# Patient Record
Sex: Male | Born: 1965 | Hispanic: No | Marital: Single | Smoking: Never smoker
Health system: Southern US, Community
[De-identification: ages and names within clinical notes are randomized; demographics above are authoritative.]

## PROBLEM LIST (undated history)

## (undated) DIAGNOSIS — E785 Hyperlipidemia, unspecified: Secondary | ICD-10-CM

## (undated) DIAGNOSIS — I10 Essential (primary) hypertension: Secondary | ICD-10-CM

## (undated) DIAGNOSIS — E119 Type 2 diabetes mellitus without complications: Secondary | ICD-10-CM

## (undated) HISTORY — DX: Hyperlipidemia, unspecified: E78.5

## (undated) HISTORY — DX: Essential (primary) hypertension: I10

## (undated) HISTORY — DX: Type 2 diabetes mellitus without complications: E11.9

---

## 2010-08-02 ENCOUNTER — Encounter: Admission: RE | Admit: 2010-08-02 | Discharge: 2010-08-02 | Payer: Self-pay | Admitting: Geriatric Medicine

## 2011-09-06 IMAGING — US US ART/VEN ABD/PELV/SCROTUM DOPPLER COMPLETE
1 series · 14 of 18 positions shown · non-contrast
Comparison: None.

CLINICAL DATA: Left testicular pain and swelling

SCROTAL ULTRASOUND
DOPPLER ULTRASOUND OF THE TESTICLES
TECHNIQUE: Complete ultrasound examination of the testicles,
epididymis, and other scrotal structures was performed.  Color and
spectral Doppler ultrasound were also utilized to evaluate blood
flow to the testicles.

[Series 1: us art/ven abd/pelv/scrotum doppler complete · 0.07mm/px · 14 of 18 slices shown]
[im 1/18]
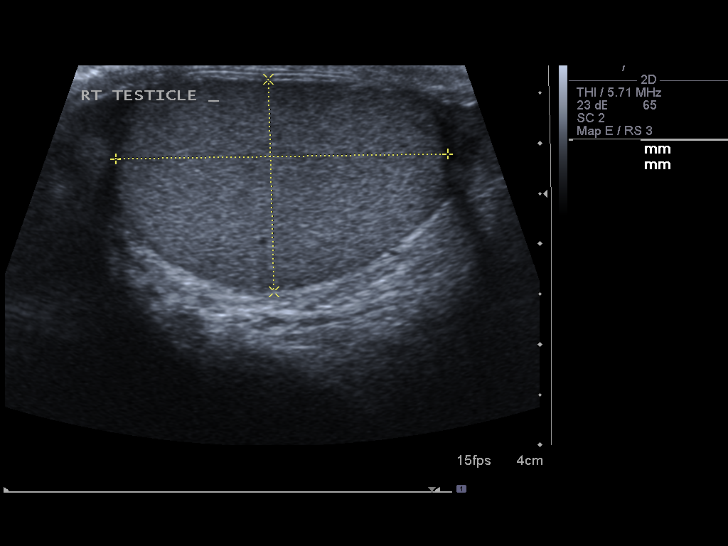
[im 2/18]
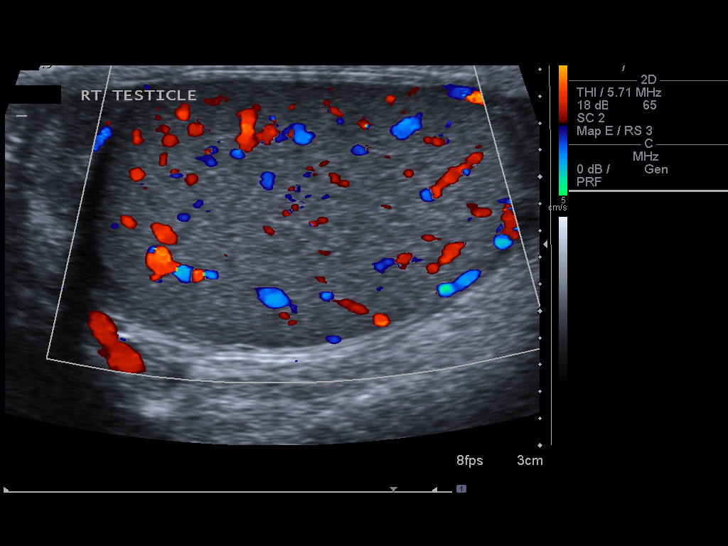
[im 4/18]
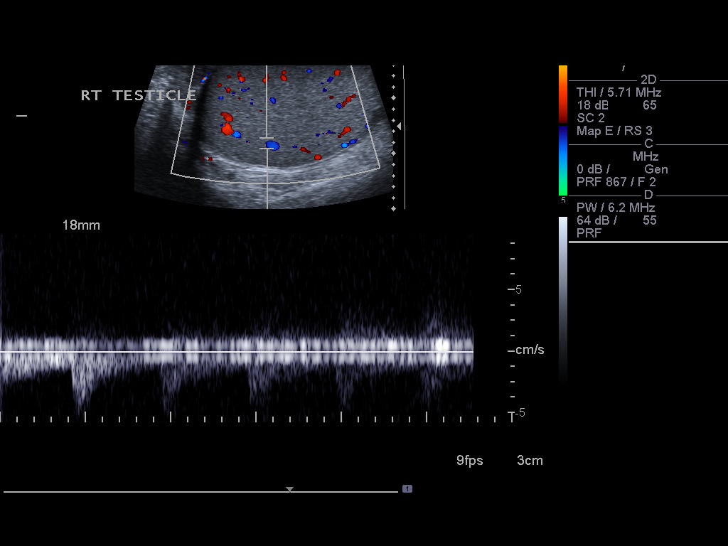
[im 5/18]
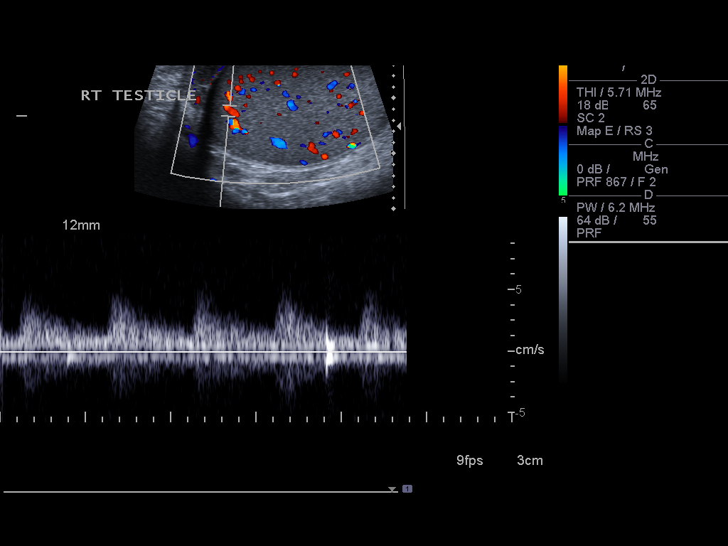
[im 6/18]
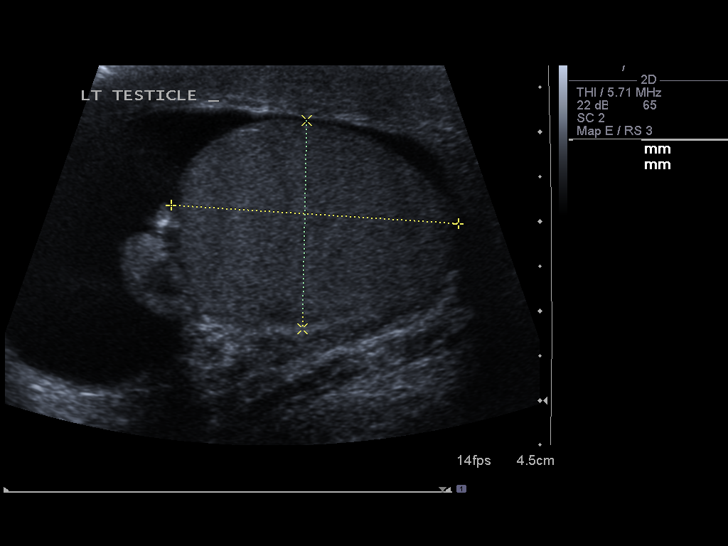
[im 8/18]
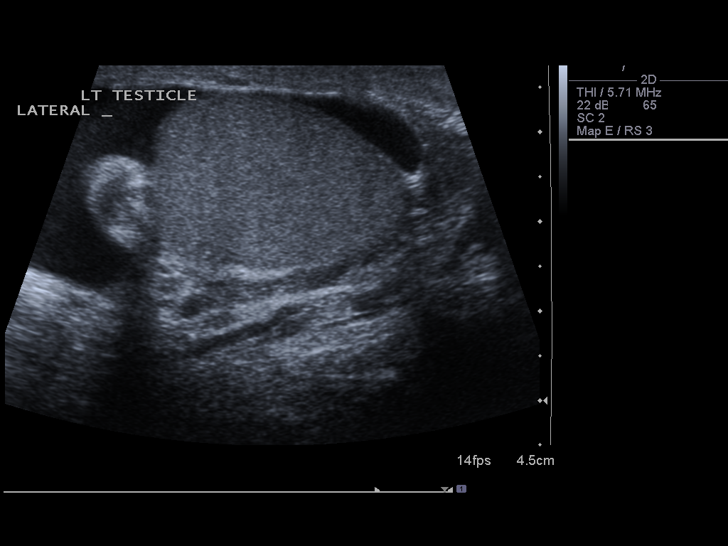
[im 9/18]
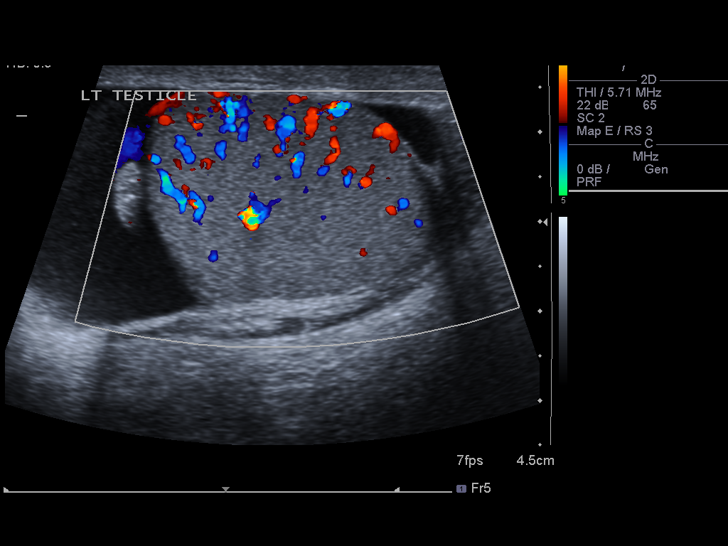
[im 10/18]
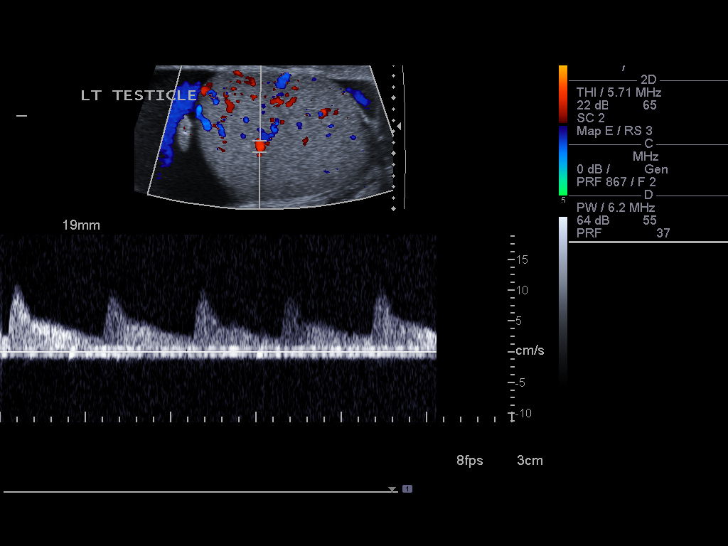
[im 11/18]
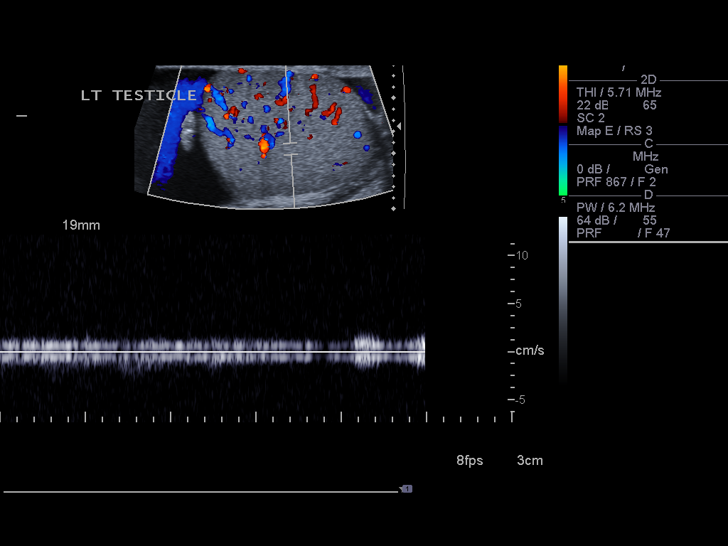
[im 13/18]
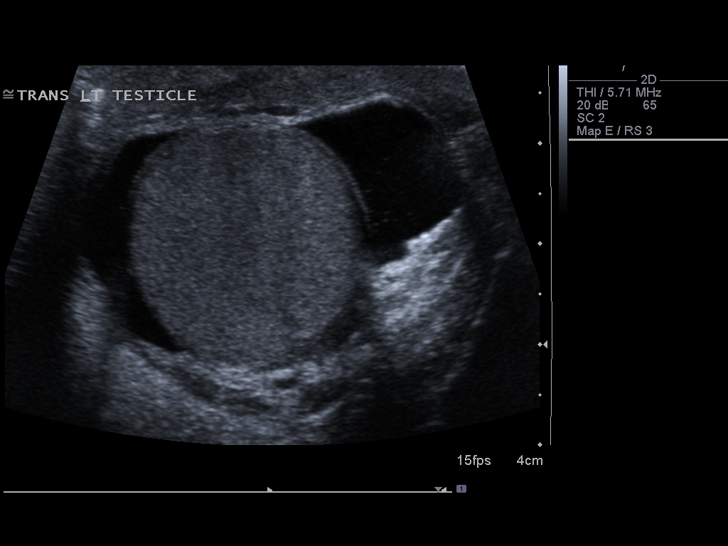
[im 14/18]
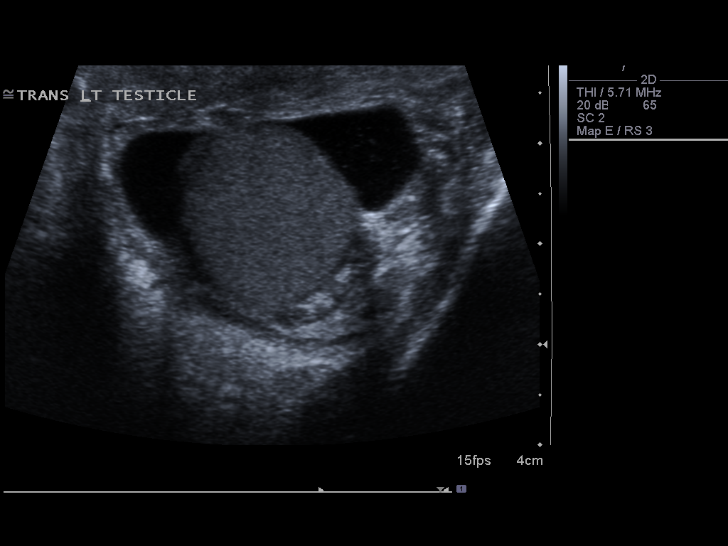
[im 15/18]
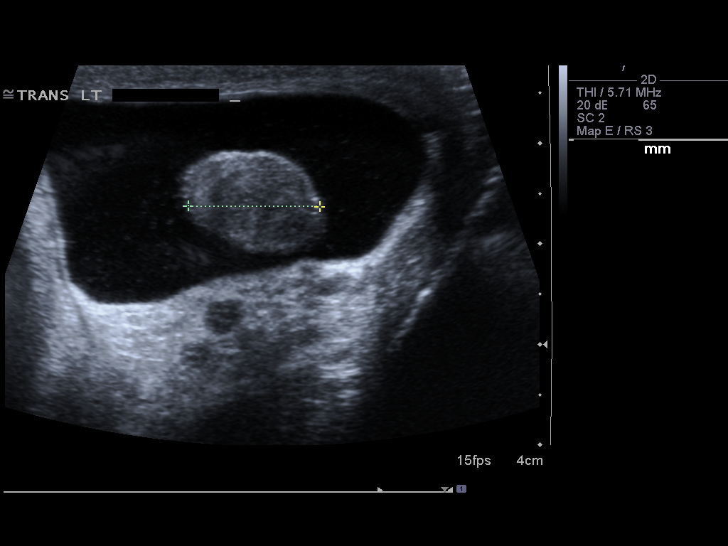
[im 17/18]
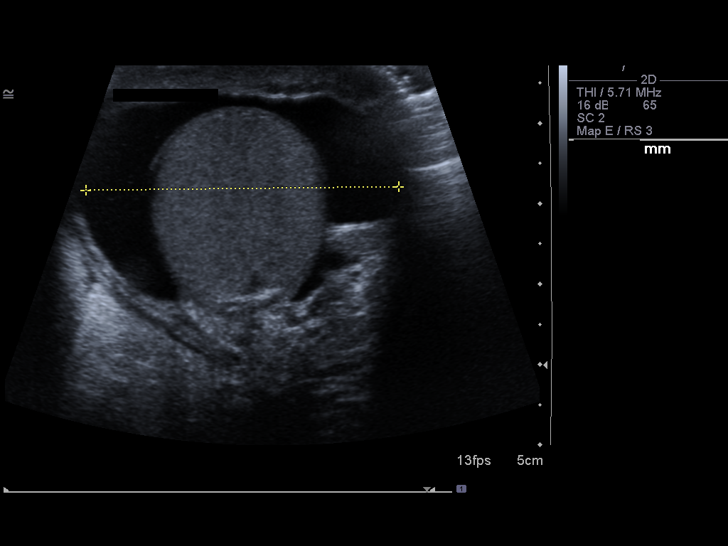
[im 18/18]
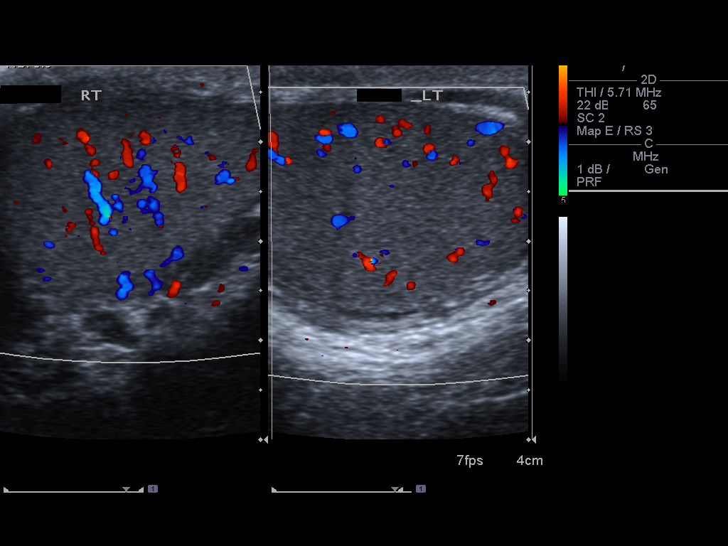

[14 of 18 positions shown; findings below may reference images not displayed]

FINDINGS: The testicles are normal in size and contour with no
focal lesions.  There is symmetrical color flow Doppler and normal
arterial and venous wave forms.

No lesions of either epididymis.  There is a moderate-sized left
hydrocele.  No varicocele.
IMPRESSION: Normal except for left hydrocele.

## 2014-05-27 ENCOUNTER — Ambulatory Visit: Payer: Self-pay

## 2014-06-05 ENCOUNTER — Ambulatory Visit: Payer: No Typology Code available for payment source | Admitting: Internal Medicine

## 2014-06-05 ENCOUNTER — Encounter: Payer: Self-pay | Admitting: Internal Medicine

## 2014-06-05 VITALS — BP 127/85 | HR 77 | Temp 98.1°F | Resp 20 | Ht 66.0 in | Wt 174.0 lb

## 2014-06-05 DIAGNOSIS — E1165 Type 2 diabetes mellitus with hyperglycemia: Principal | ICD-10-CM

## 2014-06-05 DIAGNOSIS — E785 Hyperlipidemia, unspecified: Secondary | ICD-10-CM

## 2014-06-05 DIAGNOSIS — M419 Scoliosis, unspecified: Secondary | ICD-10-CM

## 2014-06-05 DIAGNOSIS — IMO0001 Reserved for inherently not codable concepts without codable children: Secondary | ICD-10-CM | POA: Insufficient documentation

## 2014-06-05 LAB — HEMOGLOBIN A1C
Hgb A1c MFr Bld: 6.7 % — ABNORMAL HIGH (ref <5.7)
MEAN PLASMA GLUCOSE: 146 mg/dL — AB (ref <117)

## 2014-06-05 MED ORDER — METFORMIN HCL 500 MG PO TABS: 500.0000 mg | ORAL_TABLET | Freq: Every day | ORAL | Status: DC

## 2014-06-05 MED ORDER — LOVASTATIN 40 MG PO TABS: 40.0000 mg | ORAL_TABLET | Freq: Every day | ORAL | Status: DC

## 2014-06-05 NOTE — Progress Notes (Unsigned)
   Subjective:    Patient ID: Dalton Garza, male    DOB: 04-16-1966, 48 y.o.   MRN: [id number]  HPI: Pt here to establish care    Review of Systems     Objective:   Physical Exam        Assessment & Plan:  1. DM II: pt does not check blood sugars at home. He denies any symptoms of hyper or hypoglycemia. Will Check Hemoglobin A1C and renal function today. Also check U/U for proteinuria.  2. Hyperlipidemia: ASCVD 10 year risk 7..1% and lifetime calculated risk 50%. Recommendations are for moderate intensity statin. Will increase Louvastatin to 40 mg daily.

## 2014-06-06 LAB — URINALYSIS, COMPLETE
BACTERIA UA: NONE SEEN
Bilirubin Urine: NEGATIVE
Casts: NONE SEEN
Crystals: NONE SEEN
GLUCOSE, UA: NEGATIVE mg/dL
Hgb urine dipstick: NEGATIVE
KETONES UR: NEGATIVE mg/dL
LEUKOCYTES UA: NEGATIVE
Nitrite: NEGATIVE
Protein, ur: NEGATIVE mg/dL
SQUAMOUS EPITHELIAL / LPF: NONE SEEN
Specific Gravity, Urine: 1.02 (ref 1.005–1.030)
Urobilinogen, UA: 0.2 mg/dL (ref 0.0–1.0)
pH: 5.5 (ref 5.0–8.0)

## 2014-06-06 LAB — COMPREHENSIVE METABOLIC PANEL
ALK PHOS: 68 U/L (ref 39–117)
ALT: 59 U/L — AB (ref 0–53)
AST: 29 U/L (ref 0–37)
Albumin: 4.7 g/dL (ref 3.5–5.2)
BILIRUBIN TOTAL: 0.6 mg/dL (ref 0.2–1.2)
BUN: 9 mg/dL (ref 6–23)
CALCIUM: 9.6 mg/dL (ref 8.4–10.5)
CO2: 22 mEq/L (ref 19–32)
Chloride: 103 mEq/L (ref 96–112)
Creat: 0.76 mg/dL (ref 0.50–1.35)
Glucose, Bld: 98 mg/dL (ref 70–99)
POTASSIUM: 4 meq/L (ref 3.5–5.3)
SODIUM: 138 meq/L (ref 135–145)
Total Protein: 7.3 g/dL (ref 6.0–8.3)

## 2014-06-13 ENCOUNTER — Telehealth: Payer: Self-pay | Admitting: Internal Medicine

## 2014-06-13 NOTE — Telephone Encounter (Signed)
Patient would like RX for Lisinopril 5mg . He neglected to bring in bottle at initial visit and now is completely out.

## 2014-06-16 MED ORDER — LISINOPRIL 5 MG PO TABS: 5.0000 mg | ORAL_TABLET | Freq: Every day | ORAL | Status: DC

## 2014-06-16 NOTE — Telephone Encounter (Signed)
Pt called requesting Lisinopril 5 mg. He states that he forgot to disclose the use of this medication on his initial visit despite being queried about all medication use via an Landscape architectinterpretor/medical translator. We have checked with is pharmacy and he last had it prescribed 08/2013. Will prescribe one months's worth and ask patient to come in for a visit to address HTN.

## 2014-07-03 ENCOUNTER — Ambulatory Visit: Payer: No Typology Code available for payment source | Attending: Internal Medicine | Admitting: Internal Medicine

## 2014-07-03 ENCOUNTER — Ambulatory Visit: Payer: No Typology Code available for payment source | Admitting: Internal Medicine

## 2014-07-03 ENCOUNTER — Encounter: Payer: Self-pay | Admitting: Internal Medicine

## 2014-07-03 VITALS — BP 137/77 | HR 59 | Temp 97.6°F | Resp 20 | Ht 64.5 in | Wt 176.4 lb

## 2014-07-03 DIAGNOSIS — E1165 Type 2 diabetes mellitus with hyperglycemia: Principal | ICD-10-CM

## 2014-07-03 DIAGNOSIS — M79609 Pain in unspecified limb: Secondary | ICD-10-CM | POA: Insufficient documentation

## 2014-07-03 DIAGNOSIS — E785 Hyperlipidemia, unspecified: Secondary | ICD-10-CM | POA: Insufficient documentation

## 2014-07-03 DIAGNOSIS — IMO0001 Reserved for inherently not codable concepts without codable children: Secondary | ICD-10-CM | POA: Insufficient documentation

## 2014-07-03 DIAGNOSIS — R0989 Other specified symptoms and signs involving the circulatory and respiratory systems: Secondary | ICD-10-CM | POA: Insufficient documentation

## 2014-07-03 LAB — LIPID PANEL
Cholesterol: 205 mg/dL — ABNORMAL HIGH (ref 0–200)
HDL: 45 mg/dL (ref >39)
LDL Cholesterol: 118 mg/dL — ABNORMAL HIGH (ref 0–99)
TRIGLYCERIDES: 209 mg/dL — AB (ref <150)
Total CHOL/HDL Ratio: 4.6 Ratio
VLDL: 42 mg/dL — ABNORMAL HIGH (ref 0–40)

## 2014-07-03 LAB — GLUCOSE, POCT (MANUAL RESULT ENTRY): POC Glucose: 109 mg/dl — AB (ref 70–99)

## 2014-07-03 MED ORDER — FREESTYLE SYSTEM KIT: 1.0000 | PACK | Status: AC | PRN

## 2014-07-03 MED ORDER — METFORMIN HCL 500 MG PO TABS: 500.0000 mg | ORAL_TABLET | Freq: Two times a day (BID) | ORAL | Status: DC

## 2014-07-03 MED ORDER — GLUCOSE BLOOD VI STRP: ORAL_STRIP | Status: AC

## 2014-07-03 MED ORDER — FREESTYLE LANCETS MISC: Status: AC

## 2014-07-03 MED ORDER — ATORVASTATIN CALCIUM 20 MG PO TABS: 20.0000 mg | ORAL_TABLET | Freq: Every day | ORAL | Status: DC

## 2014-07-03 MED ORDER — LISINOPRIL 5 MG PO TABS: 5.0000 mg | ORAL_TABLET | Freq: Every day | ORAL | Status: DC

## 2014-07-03 MED ORDER — LOVASTATIN 40 MG PO TABS: 40.0000 mg | ORAL_TABLET | Freq: Every day | ORAL | Status: DC

## 2014-07-03 NOTE — Progress Notes (Signed)
Patient ID: Dalton Garza, male   DOB: [birth year], 48 y.o.   MRN: [id number]  QQV:[id number]  [id number]:[id number]  DOB - Nov 24, 1966  CC:  Chief Complaint  Patient presents with  . Follow-up  . Leg Pain    Left       HPI: Dalton Garza is a 48 y.o. male here today to establish medical care.  Patient reports [facility] was [facility] with somethings and would like to establish here.  Patient has a past medical history of DM, HTN, and HLD.  Today he c/o left leg pain that has been present for the past 3 years and it is now beginning to affect his mobility. Patient reports that the pain begins in his popliteal area and radiates to his hip and lower back.  He stated that the pain feels tight like muscle tension.   Patient reports that he ran out of lisinopril of few days ago and needs a refill.   No Known Allergies Past Medical History  Diagnosis Date  . Hypertension   . Diabetes mellitus without complication   . Hyperlipidemia    Current Outpatient Prescriptions on File Prior to Visit  Medication Sig Dispense Refill  . lovastatin (MEVACOR) 40 MG tablet Take 1 tablet (40 mg total) by mouth at bedtime.  90 tablet  3  . lisinopril (PRINIVIL,ZESTRIL) 5 MG tablet Take 1 tablet (5 mg total) by mouth daily.  30 tablet  0  . metFORMIN (GLUCOPHAGE) 500 MG tablet Take 1 tablet (500 mg total) by mouth daily.  90 tablet  3   No current facility-administered medications on file prior to visit.   Family History  Problem Relation Age of Onset  . Diabetes Father   . Hypertension Father   . Gout Father   . Diabetes Brother    History   Social History  . Marital Status: Unknown    Spouse Name: N/A    Number of Children: N/A  . Years of Education: N/A   Occupational History  . Not on file.   Social History Main Topics  . Smoking status: Never Smoker   . Smokeless tobacco: Not on file  . Alcohol Use: No  . Drug Use: No  . Sexual  Activity: Not on file   Other Topics Concern  . Not on file   Social History Narrative  . No narrative on file   Review of Systems  HENT: Negative.   Eyes: Negative.   Cardiovascular: Negative.   Gastrointestinal: Negative.   Genitourinary: Negative.   Neurological: Negative.   Endo/Heme/Allergies: Negative for polydipsia.       Objective:   Filed Vitals:   07/03/14 0910  BP: 137/77  Pulse: 59  Temp: 97.6 F (36.4 C)  Resp: 20   Physical Exam  Vitals reviewed. Constitutional: He is oriented to person, place, and time. He appears well-nourished.  HENT:  Left Ear: External ear normal.  Mouth/Throat: Oropharynx is clear and moist.  Eyes: Conjunctivae are normal. Pupils are equal, round, and reactive to light.  Neck: Normal range of motion. Neck supple. No thyromegaly present.  Cardiovascular: Normal rate, regular rhythm and normal heart sounds.   Pulses:      Dorsalis pedis pulses are 2+ on the right side, and 0 on the left side.       Posterior tibial pulses are 2+ on the right side, and 1+ on the left side.  Normal cap refill in BLE  Pulmonary/Chest: Effort normal and breath sounds normal.  Abdominal: Soft. Bowel sounds are normal.  Musculoskeletal: He exhibits no edema and no tenderness.       Left ankle: He exhibits abnormal pulse (absent pedal pulss, 1+ posterior tibalis pulse in left).  Abnormal gait, appears to bear more weight on right leg Marked scoliosis   Neurological: He is alert and oriented to person, place, and time.  Skin: Skin is warm and dry.  Psychiatric: He has a normal mood and affect. Thought content normal.     No results found for this basename: WBC, HGB, HCT, MCV, PLT   Lab Results  Component Value Date   CREATININE 0.76 06/05/2014   BUN 9 06/05/2014   NA 138 06/05/2014   K 4.0 06/05/2014   CL 103 06/05/2014   CO2 22 06/05/2014    Lab Results  Component Value Date   HGBA1C 6.7* 06/05/2014   Lipid Panel  No results found for this  basename: chol, trig, hdl, cholhdl, vldl, ldlcalc       Assessment and plan:   Peretz was seen today for follow-up and leg pain.  Diagnoses and associated orders for this visit:  Type II or unspecified type diabetes mellitus without mention of complication, uncontrolled - Glucose (CBG) - metFORMIN (GLUCOPHAGE) 500 MG tablet; Take 1 tablet (500 mg total) by mouth 2 (two) times daily with a meal. - lisinopril (PRINIVIL,ZESTRIL) 5 MG tablet; Take 1 tablet (5 mg total) by mouth daily. - Discontinue: lovastatin (MEVACOR) 40 MG tablet; Take 1 tablet (40 mg total) by mouth at bedtime. - Ambulatory referral to Ophthalmology - PSA - Lipid panel - glucose monitoring kit (FREESTYLE) monitoring kit; 1 each by Does not apply route as needed for other. - glucose blood test strip; Use as instructed - Lancets (FREESTYLE) lancets; Use as instructed  Hyperlipidemia - atorvastatin (LIPITOR) 20 MG tablet; Take 1 tablet (20 mg total) by mouth daily. Absent pedal pulses - Lower Extremity Arterial Doppler Bilateral; Future Patient may need ABI next, to r/o PVD Left leg pain High suspicion of PVD  Return in about 3 months (around 10/03/2014) for DM/HTN.  The patient was given clear instructions to go to ER or return to medical center if symptoms don't improve, worsen or new problems develop. The patient verbalized understanding.  Due to language barrier, an interpreter was present during the history-taking and subsequent discussion (and for part of the physical exam) with this patient.  Chari Manning, [facility] [phone] 07/03/2014, 9:30 AM

## 2014-07-03 NOTE — Patient Instructions (Signed)
Enfermedad Vascular Perifrica (Peripheral Vascular Disease) La enfermedad vascular perifrica (EVP) es causada por la acumulacin de colesterol en las arterias. Las arterias se vuelven estrechas o quedan obstruidas. Esto dificulta el paso de la [place]. Ocurre con ms frecuencia en las piernas, pero puede ocurrir en otras partes del cuerpo. CUIDADOS EN EL HOGAR   Dejar de fumar, si fuma.  Realice actividad fsica segn las indicaciones del profesional que lo asiste.  Siga una dieta baja en grasas y en colesterol, segn le indique el mdico.  Si tiene diabetes, controle este trastorno.  Cuide sus piernas para prevenir una infeccin.  Slo tome medicamentos como lo indique su mdico. SOLICITE AYUDA DE INMEDIATO SI:   Siente dolor o pierde la sensibilidad (adormecimiento) en los brazos o en las piernas.  Sus brazos o piernas presentan una coloracin azul o se ponen fros.  Observa enrojecimiento, calor e hinchazn (inflamacin) en los brazos o en las piernas. ASEGRESE DE QUE:   Comprende estas instrucciones.  Controlar su enfermedad.  Solicitar ayuda de inmediato si no mejora o empeora. Document Released: 01/14/2011 Document Revised: 03/05/2012 [facility] Patient Information 2015 [place], [place]. This information is not intended to replace advice given to you by your health care provider. Make sure you discuss any questions you have with your health care provider. Plan de alimentacin DASH (DASH Eating Plan) DASH es la sigla en ingls de "Enfoques alimentarios para bajar la hipertensin". El plan de alimentacin DASH ha demostrado bajar la presin arterial elevada (hipertensin). Los beneficios adicionales para la salud pueden incluir la disminucin del riesgo de diabetes mellitus tipo2, enfermedades cardacas e ictus. Este plan tambin puede ayudar a Geophysical data processoradelgazar. QU DEBO SABER ACERCA DEL PLAN DE ALIMENTACIN DASH? Para el plan de alimentacin DASH, seguir las siguientes pautas  generales:  Elija los alimentos con un valor porcentual diario de sodio de menos del 5% (segn figura en la etiqueta del alimento).  Use hierbas o aderezos sin sal, en lugar de sal de mesa o sal marina.  Consulte al mdico o farmacutico antes de usar sustitutos de la sal.  Coma productos con bajo contenido de sodio, cuya etiqueta suele decir "bajo contenido de sodio" o "sin agregado de sal".  Coma alimentos frescos.  Coma ms verduras, frutas y productos lcteos con bajo contenido de [place]grasas.  Elija los cereales integrales. Busque el trmino "integrales" como la segunda palabra en la lista de ingredientes.  Elija el pescado y el pollo o el pavo sin piel ms a menudo que las carnes rojas. Limite el consumo de pescado, carne de ave y carne a 6onzas (170g) por Futures traderda.  Limite el consumo de dulces, postres, azcares y bebidas azucaradas.  Elija las grasas saludables para el corazn.  Limite el consumo de queso a 1oz (28g) por Futures traderda.  Consuma ms alimentos caseros y Lowe's Companiesmenos comida rpida, de bufs o de restaurantes.  Limite el consumo de alimentos fritos.  Cocine los alimentos con otros mtodos que no sean la fritura.  Limite las verduras enlatadas. Si las consume, enjuguelas bien para disminuir el sodio.  Cuando coma en un restaurante, pida que preparen su comida con menos sal o, en lo posible, sin nada de sal. QU ALIMENTOS PUEDO COMER? Pida ayuda a un nutricionista para conocer las necesidades calricas individuales. Cereales Pan de salvado o integral. Arroz integral. Pastas de salvado o integrales. Quinua, trigo burgol y cereales integrales. Cereales con bajo contenido de sodio. Tortillas de harina de maz o de salvado. Pan de maz integral. Galletas saladas integrales.  Galletas con bajo contenido de [place]. Vegetales Verduras frescas o congeladas (crudas, al vapor, asadas o grilladas). Jugos de tomate y verduras con contenido bajo o reducido de sodio. Pasta y salsa de tomate con  contenido bajo o reducido de sodio. Verduras enlatadas con bajo contenido de sodio o reducido de sodio.  Nils Pyle Nils Pyle frescas, en conserva (en su jugo natural) o frutas congeladas. Carnes y otras fuentes de protenas Carne de res [place] (al 85% o ms [place]), carne de res de animales alimentados con pastos o carne de res sin la grasa. Pollo o pavo sin piel. Carne de pollo o de [place]. Cerdo sin la grasa. Todos los pescados y frutos de mar. Huevos. Porotos, guisantes o lentejas secos. Frutos secos y semillas sin sal. Frijoles enlatados sin sal. Lcteos Productos lcteos con bajo contenido de grasas, como [place] o al 1%, quesos reducidos en grasas o al 2%, ricota con bajo contenido de grasas o [employer], o yogur natural con bajo contenido de [place]. Quesos con contenido bajo o reducido de sodio. Grasas y Writer en barra que no contengan grasas trans. [place] y alios para ensaladas livianos o reducidos en grasas (reducidos en sodio). [place]. Aceites de crtamo, oliva o canola. Mantequilla natural de man o almendra. Otros Palomitas de maz y pretzels sin sal. Esta no es [address] de los alimentos o las bebidas recomendados. Consulte a su nutricionista para conocer ms opciones. QU ALIMENTOS NO ESTN RECOMENDADOS? Cereales Pan blanco. Pastas blancas. Arroz blanco. Pan de maz refinado. Bagels y croissants. Galletas saladas que contengan grasas trans. Vegetales Vegetales con crema o fritos. Verduras en salsa de [place]. Verduras enlatadas comunes. Pasta y salsa de tomate en lata comunes. Jugos comunes de tomate y de verduras. Nils Pyle Frutas secas. Fruta enlatada en almbar liviano o espeso. Jugo de frutas. Carnes y [address] fuentes de protenas [place] de carne con Holiday representative. Costillas, alas de pollo, tocineta, salchicha, mortadela, salame, chinchulines, tocino, perros calientes, salchichas alemanas y embutidos envasados. Frutos secos y semillas con sal. Frijoles  con sal en lata. Lcteos Leche entera o al 2%, crema, mezcla de [place] y crema y queso crema. Yogur entero o endulzado. Quesos o queso azul con alto contenido de Neurosurgeon. Cremas y coberturas batidas no lcteas. Quesos procesados, quesos para untar o cuajadas. Condimentos Sal de cebolla y ajo, sal condimentada, sal de mesa y sal marina. Salsas en lata y envasadas. Salsa Worcestershire. Salsa trtara. Salsa barbacoa. Salsa teriyaki. Salsa de soja, incluso la que tiene contenido reducido de [place]. Salsa de carne. Salsa de pescado. Salsa de [place]. Salsa rosada. Rbanos picantes. Ketchup y mostaza. Saborizantes y tiernizantes para carne. Caldo en cubitos. Salsa picante. Salsa tabasco. Adobos. Aderezos para tacos. Salsas. Grasas y [address], [place] en barra, [place] de [place], [place], [place] clarificada y Steffanie [place] de tocineta. Aceites de coco, de palmiste o de palma. Aderezos comunes para ensalada. Otros Pickles y [place]. Palomitas de maz y pretzels con sal. Esta no es [employer] de los alimentos y las bebidas que Personnel officer. Consulte a su nutricionista para obtener ms informacin. DNDE Raelyn Mora MS INFORMACIN? [employer] [place], [place] y Risk manager (National Heart, Lung, and Blood Institute): [email or web address].it Document Released: 12/01/2011 Document Revised: 12/17/2013 [facility] Patient Information 2015 [place], [place]. This information is not intended to replace advice given to you by your health care provider. Make sure you discuss any questions you have with your health care provider.

## 2014-07-03 NOTE — Progress Notes (Signed)
Patient presents for 3 year history of left leg pain. States increasing difficulty walking due to leg pain Sister-in-law and interpreter present

## 2014-07-04 ENCOUNTER — Ambulatory Visit (HOSPITAL_COMMUNITY)
Admission: RE | Admit: 2014-07-04 | Discharge: 2014-07-04 | Disposition: A | Discharge status: Home / Self Care | Payer: No Typology Code available for payment source | Source: Ambulatory Visit | Attending: Internal Medicine | Admitting: Internal Medicine

## 2014-07-04 DIAGNOSIS — E785 Hyperlipidemia, unspecified: Secondary | ICD-10-CM

## 2014-07-04 DIAGNOSIS — R0989 Other specified symptoms and signs involving the circulatory and respiratory systems: Secondary | ICD-10-CM

## 2014-07-04 DIAGNOSIS — M79609 Pain in unspecified limb: Secondary | ICD-10-CM

## 2014-07-04 DIAGNOSIS — IMO0001 Reserved for inherently not codable concepts without codable children: Secondary | ICD-10-CM

## 2014-07-04 DIAGNOSIS — E1165 Type 2 diabetes mellitus with hyperglycemia: Secondary | ICD-10-CM

## 2014-07-04 LAB — PSA: PSA: 0.62 ng/mL (ref <=4.00)

## 2014-07-04 NOTE — Progress Notes (Signed)
VASCULAR LAB PRELIMINARY  ARTERIAL  ABI completed: ABI within normal limits. Toe pressures and waveforms within normal limits.    RIGHT    LEFT    PRESSURE WAVEFORM  PRESSURE WAVEFORM  BRACHIAL 134 Tri BRACHIAL 128 Tri  DP   DP    AT 162 Tri AT 132 Bi  PT 165 Tri PT 135 Tri  PER   PER    GREAT TOE 180 NA GREAT TOE 170 NA    RIGHT LEFT  ABI 1.23 1.23     Farrel DemarkJill Eunice, RDMS, RVT  07/04/2014, 12:14 PM

## 2014-07-09 ENCOUNTER — Telehealth: Payer: Self-pay | Admitting: Internal Medicine

## 2014-07-09 ENCOUNTER — Telehealth: Payer: Self-pay | Admitting: Emergency Medicine

## 2014-07-09 NOTE — Telephone Encounter (Signed)
Pt requesting doppler results. Please f/u

## 2014-07-09 NOTE — Telephone Encounter (Signed)
Pt calling regarding results for cardiac cath procedure performed last week.  Please f/u with pt.

## 2014-07-10 ENCOUNTER — Ambulatory Visit (INDEPENDENT_AMBULATORY_CARE_PROVIDER_SITE_OTHER): Payer: No Typology Code available for payment source | Admitting: Home Health Services

## 2014-07-10 DIAGNOSIS — E119 Type 2 diabetes mellitus without complications: Secondary | ICD-10-CM

## 2014-07-10 NOTE — Progress Notes (Signed)
DIABETES Pt came in to have a retinal scan per diabetic care.   Image was taken and submitted to [facility] for reading.    Results will be available in 1-2 weeks.  Results will be given to PCP for review and to contact patient.  Dalton Garza  

## 2014-07-11 ENCOUNTER — Telehealth: Payer: Self-pay | Admitting: *Deleted

## 2014-07-11 NOTE — Telephone Encounter (Signed)
Left message

## 2014-07-11 NOTE — Telephone Encounter (Signed)
Message copied by Raynelle CharyWINFREE, Tafari Humiston R on Fri Jul 11, 2014 11:59 AM ------      Message from: Holland CommonsKECK, VALERIE A      Created: Wed Jul 09, 2014  9:25 AM       Make sure patient is still taking lipitor, cholesterol is only slightly elevated. Educated patient on diet and exercise to help lower cholesterol as well.  Avoid excess fried foods, starches, alcohol ------

## 2014-07-21 ENCOUNTER — Telehealth: Payer: Self-pay | Admitting: *Deleted

## 2014-07-21 NOTE — Progress Notes (Signed)
Please see telephone note from today's visit

## 2014-07-21 NOTE — Telephone Encounter (Signed)
[facility] that arterial blood flow is normal and there is no clot. Also reviewed lab work. Patient states that he is taking lipitor as directed. Also discussed diet and exercise changes to help lower cholesterol. And to avoid fried foods starches and alcohol.  Patient states understanding.

## 2014-07-21 NOTE — Telephone Encounter (Signed)
Message copied by Fredderick SeveranceUCATTE, LAURENZE L on Mon Jul 21, 2014 10:42 AM ------      Message from: Holland CommonsKECK, VALERIE A      Created: Thu Jul 17, 2014  7:11 PM       Arterial blood flow is normal. No PVD ------

## 2014-07-22 NOTE — Telephone Encounter (Signed)
Dalton Garza calling on behalf of pt and says that they have not received call with results or next steps for pt. Please f/u with pt.

## 2014-07-23 ENCOUNTER — Telehealth: Payer: Self-pay | Admitting: Internal Medicine

## 2014-07-23 NOTE — Telephone Encounter (Signed)
Pt;s friend has come in today on behalf of patient requesting a nurse call or to come in for a visit; PT is continuing to have problems with his leg; [facility] notes were normal however Pt is still limping with no complaints of pain; please f/u with pt by phone to communicate a time to come in for a visit;

## 2014-07-24 ENCOUNTER — Telehealth: Payer: Self-pay | Admitting: *Deleted

## 2014-07-24 DIAGNOSIS — M25562 Pain in left knee: Secondary | ICD-10-CM

## 2014-07-24 NOTE — Telephone Encounter (Addendum)
Left message on sister-in-law's VM that radiology was closed MRI will be scheduled tomorrow 07/25/14 and she will be informed of appt date and time.

## 2014-07-24 NOTE — Telephone Encounter (Signed)
Barbaraann BoysKaren Rivera at 07/22/2014 4:43 PM     Status: Signed        Dalton Garza calling on behalf of pt and says that they have not received call with results or next steps for pt. Please f/u with pt.       Dalton Garza Signed Dalton Garza 07/23/2014 5:28 PM         Telephone Encounter    Pt;s friend has come in today on behalf of patient requesting a nurse call or to come in for a visit; PT is continuing to have problems with his leg; [facility] V[facility] center notes were normal however Pt is still limping with no complaints of pain; please f/u with pt by phone to communicate a time to come in for a visit;     Spoke with Charlene, patient's sister-in-law who accompanied patient to last visit. Explained that results were discussed with patient via pacific interpreter on 07/21/14. MRI of left knee placed per provider for continued left knee pain and limping

## 2014-07-25 ENCOUNTER — Telehealth: Payer: Self-pay | Admitting: Emergency Medicine

## 2014-07-25 NOTE — Telephone Encounter (Signed)
Pt given scheduled appointment for MRI left knee on 08/04/14 with arrival time 745am via pacific interpretor Pt verbalized understanding

## 2014-08-04 ENCOUNTER — Telehealth: Payer: Self-pay | Admitting: Internal Medicine

## 2014-08-04 ENCOUNTER — Ambulatory Visit (HOSPITAL_COMMUNITY)
Admission: RE | Admit: 2014-08-04 | Discharge: 2014-08-04 | Disposition: A | Discharge status: Home / Self Care | Payer: No Typology Code available for payment source | Source: Ambulatory Visit | Attending: Internal Medicine | Admitting: Internal Medicine

## 2014-08-04 ENCOUNTER — Telehealth: Payer: Self-pay | Admitting: Emergency Medicine

## 2014-08-04 DIAGNOSIS — M25562 Pain in left knee: Secondary | ICD-10-CM

## 2014-08-04 NOTE — Telephone Encounter (Signed)
Pt went in for MRI of leg, pt though he was having MRI done of knee and up orders read for MRI to be done of knee area and lower leg. Pt took off day from work for procedure and then had to cancel appt because orders were incorrect. Please f/u with pt or pt's ED contact, Carlene [phone]. There is a slot available tomorrow morning, pt would like to be scheduled for.

## 2014-08-04 NOTE — Telephone Encounter (Signed)
Spoke with Vikki PortsValerie after speaking with pt. Pt informed Vikki PortsValerie ordered MRI left knee to look for possible muscle tear. Pt will be rescheduled for MRI.  Pacific interpretor used for communication

## 2014-09-02 ENCOUNTER — Ambulatory Visit: Payer: No Typology Code available for payment source | Admitting: Internal Medicine

## 2014-09-09 ENCOUNTER — Ambulatory Visit: Payer: No Typology Code available for payment source | Admitting: Internal Medicine

## 2014-10-02 ENCOUNTER — Encounter: Payer: Self-pay | Admitting: Internal Medicine

## 2014-10-02 ENCOUNTER — Ambulatory Visit: Payer: No Typology Code available for payment source | Attending: Internal Medicine | Admitting: Internal Medicine

## 2014-10-02 VITALS — BP 110/68 | HR 62 | Temp 97.8°F | Resp 14 | Ht 65.0 in | Wt 171.0 lb

## 2014-10-02 DIAGNOSIS — I10 Essential (primary) hypertension: Secondary | ICD-10-CM

## 2014-10-02 DIAGNOSIS — Z23 Encounter for immunization: Secondary | ICD-10-CM

## 2014-10-02 DIAGNOSIS — E119 Type 2 diabetes mellitus without complications: Secondary | ICD-10-CM

## 2014-10-02 DIAGNOSIS — E785 Hyperlipidemia, unspecified: Secondary | ICD-10-CM

## 2014-10-02 LAB — POCT GLYCOSYLATED HEMOGLOBIN (HGB A1C): HEMOGLOBIN A1C: 6.3

## 2014-10-02 LAB — POCT CBG (FASTING - GLUCOSE)-MANUAL ENTRY: Glucose Fasting, POC: 103 mg/dL — AB (ref 70–99)

## 2014-10-02 MED ORDER — METFORMIN HCL 500 MG PO TABS: 500.0000 mg | ORAL_TABLET | Freq: Two times a day (BID) | ORAL | Status: AC

## 2014-10-02 MED ORDER — LISINOPRIL 5 MG PO TABS: 5.0000 mg | ORAL_TABLET | Freq: Every day | ORAL | Status: AC

## 2014-10-02 MED ORDER — ATORVASTATIN CALCIUM 20 MG PO TABS: 20.0000 mg | ORAL_TABLET | Freq: Every day | ORAL | Status: AC

## 2014-10-02 NOTE — Progress Notes (Signed)
Patient presents for 3 month f/u on T2DM, HTN and hyperlipidemia Needs med refills States he has not been checking CBG at home

## 2014-10-02 NOTE — Patient Instructions (Signed)
Plan de alimentacin DASH (DASH Eating Plan) DASH es la sigla en ingls de "Enfoques Alimentarios para Detener la Hipertensin". El plan de alimentacin DASH ha demostrado bajar la presin arterial elevada (hipertensin). Los beneficios adicionales para la salud pueden incluir la disminucin del riesgo de diabetes mellitus tipo2, enfermedades cardacas e ictus. Este plan tambin puede ayudar a adelgazar. QU DEBO SABER ACERCA DEL PLAN DE ALIMENTACIN DASH? Para el plan de alimentacin DASH, seguir las siguientes pautas generales:  Elija los alimentos con un valor porcentual diario de sodio de menos del 5% (segn figura en la etiqueta del alimento).  Use hierbas o aderezos sin sal, en lugar de sal de mesa o sal marina.  Consulte al mdico o farmacutico antes de usar sustitutos de la sal.  Coma productos con bajo contenido de sodio, cuya etiqueta suele decir "bajo contenido de sodio" o "sin agregado de sal".  Coma alimentos frescos.  Coma ms verduras, frutas y productos lcteos con bajo contenido de grasas.  Elija los cereales integrales. Busque la palabra "integral" en el primer lugar de la lista de ingredientes.  Elija el pescado y el pollo o el pavo sin piel ms a menudo que las carnes rojas. Limite el consumo de pescado, carne de ave y carne a 6onzas (170g) por da.  Limite el consumo de dulces, postres, azcares y bebidas azucaradas.  Elija las grasas saludables para el corazn.  Limite el consumo de queso a 1onza (28g) por da.  Consuma ms comida casera y menos de restaurante, de buf y comida rpida.  Limite el consumo de alimentos fritos.  Cocine los alimentos utilizando mtodos que no sean la fritura.  Limite las verduras enlatadas. Si las consume, enjuguelas bien para disminuir el sodio.  Cuando coma en un restaurante, pida que preparen su comida con menos sal o, en lo posible, sin nada de sal. QU ALIMENTOS PUEDO COMER? Pida ayuda a un nutricionista para  conocer las necesidades calricas individuales. Cereales Pan de salvado o integral. Arroz integral. Pastas de salvado o integrales. Quinua, trigo burgol y cereales integrales. Cereales con bajo contenido de sodio. Tortillas de harina de maz o de salvado. Pan de maz integral. Galletas saladas integrales. Galletas con bajo contenido de sodio. Vegetales Verduras frescas o congeladas (crudas, al vapor, asadas o grilladas). Jugos de tomate y verduras con contenido bajo o reducido de sodio. Pasta y salsa de tomate con contenido bajo o reducido de sodio. Verduras enlatadas con bajo contenido de sodio o reducido de sodio.  Frutas Frutas frescas, en conserva (en su jugo natural) o frutas congeladas. Carnes y otros productos con protenas Carne de res molida (al 85% o ms magra), carne de res de animales alimentados con pastos o carne de res sin la grasa. Pollo o pavo sin piel. Carne de pollo o de pavo molida. Cerdo sin la grasa. Todos los pescados y frutos de mar. Huevos. Porotos, guisantes o lentejas secos. Frutos secos y semillas sin sal. Frijoles enlatados sin sal. Lcteos Productos lcteos con bajo contenido de grasas, como leche descremada o al 1%, quesos reducidos en grasas o al 2%, ricota con bajo contenido de grasas o queso cottage, o yogur natural con bajo contenido de grasas. Quesos con contenido bajo o reducido de sodio. Grasas y aceites Margarinas en barra que no contengan grasas trans. Mayonesa y alios para ensaladas livianos o reducidos en grasas (reducidos en sodio). [place]. Aceites de crtamo, oliva o canola. Mantequilla natural de man o almendra. Otros Palomitas de maz y pretzels sin sal.   Los artculos mencionados arriba pueden no ser una lista completa de las bebidas o los alimentos recomendados. Comunquese con el nutricionista para conocer ms opciones. QU ALIMENTOS NO SE RECOMIENDAN? Cereales Pan blanco. Pastas blancas. Arroz blanco. Pan de maz refinado. Bagels y  croissants. Galletas saladas que contengan grasas trans. Vegetales Vegetales con crema o fritos. Verduras en salsa de queso. Verduras enlatadas comunes. Pasta y salsa de tomate en lata comunes. Jugos comunes de tomate y de verduras. Frutas Frutas secas. Fruta enlatada en almbar liviano o espeso. Jugo de frutas. Carnes y otros productos con protenas Cortes de carne con grasa. Costillas, alas de pollo, tocineta, salchicha, mortadela, salame, chinchulines, tocino, perros calientes, salchichas alemanas y embutidos envasados. Frutos secos y semillas con sal. Frijoles con sal en lata. Lcteos Leche entera o al 2%, crema, mezcla de leche y crema, y queso crema. Yogur entero o endulzado. Quesos o queso azul con alto contenido de grasas. Cremas no lcteas y coberturas batidas. Quesos procesados, quesos para untar o cuajadas. Condimentos Sal de cebolla y ajo, sal condimentada, sal de mesa y sal marina. Salsas en lata y envasadas. Salsa Worcestershire. Salsa trtara. Salsa barbacoa. Salsa teriyaki. Salsa de soja, incluso la que tiene contenido reducido de sodio. Salsa de carne. Salsa de pescado. Salsa de ostras. Salsa rosada. Rbano picante. Ketchup y mostaza. Saborizantes y tiernizantes para carne. Caldo en cubitos. Salsa picante. Salsa tabasco. Adobos. Aderezos para tacos. Salsas. Grasas y aceites Mantequilla, margarina en barra, manteca de cerdo, grasa, mantequilla clarificada y grasa de tocino. Aceites de coco, de palmiste o de palma. Aderezos comunes para ensalada. Otros Pickles y aceitunas. Palomitas de maz y pretzels con sal. Los artculos mencionados arriba pueden no ser una lista completa de las bebidas y los alimentos que se deben evitar. Comunquese con el nutricionista para obtener ms informacin. DNDE PUEDO ENCONTRAR MS INFORMACIN? [employer] [facility] [facility] [employer] [employer] [facility] (National Heart, Lung, and Blood Institute):  www.nhlbi.nih.gov/health/health-topics/topics/dash/ Document Released: 12/01/2011 Document Revised: 04/28/2014 [employer] Patient Information 2015 [employer]. This information is not intended to replace advice given to you by your health care provider. Make sure you discuss any questions you have with your health care provider.   

## 2014-10-02 NOTE — Progress Notes (Signed)
Patient ID: Dalton Garza, male   DOB: 02-05-1966, 48 y.o.   MRN: [id number]  CC: DM, HTN  HPI:  Patient presents to clinic today for a follow up of DM, HTN, and HLD.  Patient reports that he takes his medication daily without skipping any doses.  He has been eating healthy and exercising.  He has lost a total of five pounds since his last office visit.  He reports mild burning of his bilateral lower extremities.  He denies any complications with medication use.    No Known Allergies Past Medical History  Diagnosis Date  . Hypertension   . Diabetes mellitus without complication   . Hyperlipidemia    Current Outpatient Prescriptions on File Prior to Visit  Medication Sig Dispense Refill  . atorvastatin (LIPITOR) 20 MG tablet Take 1 tablet (20 mg total) by mouth daily.  30 tablet  3  . lisinopril (PRINIVIL,ZESTRIL) 5 MG tablet Take 1 tablet (5 mg total) by mouth daily.  30 tablet  3  . glucose blood test strip Use as instructed  100 each  12  . glucose monitoring kit (FREESTYLE) monitoring kit 1 each by Does not apply route as needed for other.  1 each  0  . Lancets (FREESTYLE) lancets Use as instructed  100 each  12  . metFORMIN (GLUCOPHAGE) 500 MG tablet Take 1 tablet (500 mg total) by mouth 2 (two) times daily with a meal.  60 tablet  3   No current facility-administered medications on file prior to visit.   Family History  Problem Relation Age of Onset  . Diabetes Father   . Hypertension Father   . Gout Father   . Diabetes Brother    History   Social History  . Marital Status: Single    Spouse Name: N/A    Number of Children: N/A  . Years of Education: N/A   Occupational History  . Not on file.   Social History Main Topics  . Smoking status: Never Smoker   . Smokeless tobacco: Not on file  . Alcohol Use: No  . Drug Use: No  . Sexual Activity: Not on file   Other Topics Concern  . Not on file   Social History Narrative  . No narrative on file    Review  of Systems: Constitutional: Negative for fever, chills, diaphoresis, activity change, appetite change and fatigue. HENT: Negative for ear pain, nosebleeds, congestion, facial swelling, rhinorrhea, neck pain, neck stiffness and ear discharge.  Eyes: Negative for pain, discharge, redness, itching and visual disturbance. Respiratory: Negative for cough, choking, chest tightness, shortness of breath, wheezing and stridor.  Cardiovascular: Negative for chest pain, palpitations and leg swelling. Gastrointestinal: Negative for abdominal distention. Genitourinary: Negative for dysuria, urgency, frequency, hematuria, flank pain, decreased urine volume, difficulty urinating and dyspareunia.  Musculoskeletal: Negative for back pain, joint swelling, arthralgias and gait problem. Neurological: Negative for dizziness, tremors, seizures, syncope, facial asymmetry, speech difficulty, weakness, light-headedness, numbness and headaches. +Neuropathy Hematological: Negative for adenopathy. Does not bruise/bleed easily. Psychiatric/Behavioral: Negative for hallucinations, behavioral problems, confusion, dysphoric mood, decreased concentration and agitation.    Objective:   Filed Vitals:   10/02/14 0914  BP: 110/68  Pulse: 62  Temp: 97.8 F (36.6 C)  Resp: 14    Physical Exam  Constitutional: He is oriented to person, place, and time.  Cardiovascular: Normal rate, regular rhythm, normal heart sounds and intact distal pulses.   Pulmonary/Chest: Effort normal and breath sounds normal.  Abdominal: Soft. Bowel sounds  are normal.  Musculoskeletal: He exhibits no edema and no tenderness.  Neurological: He is alert and oriented to person, place, and time. He has normal reflexes.  Skin: Skin is warm and dry.     No results found for this basename: WBC, HGB, HCT, MCV, PLT   Lab Results  Component Value Date   CREATININE 0.76 06/05/2014   BUN 9 06/05/2014   NA 138 06/05/2014   K 4.0 06/05/2014   CL 103  06/05/2014   CO2 22 06/05/2014    Lab Results  Component Value Date   HGBA1C 6.3 10/02/2014   Lipid Panel     Component Value Date/Time   CHOL 205* 07/03/2014 0951   TRIG 209* 07/03/2014 0951   HDL 45 07/03/2014 0951   CHOLHDL 4.6 07/03/2014 0951   VLDL 42* 07/03/2014 0951   LDLCALC 118* 07/03/2014 0951       Assessment and plan:   Dalton Garza was seen today for follow-up and hypertension.  Diagnoses and associated orders for this visit:  Type 2 diabetes mellitus without complication - Glucose (CBG), Fasting - HgB A1c--6.3 - Continue metFORMIN (GLUCOPHAGE) 500 MG tablet; Take 1 tablet (500 mg total) by mouth 2 (two) times daily with a meal. - Microalbumin, urine Continue dash diet  Essential hypertension - Continue lisinopril (PRINIVIL,ZESTRIL) 5 MG tablet; Take 1 tablet (5 mg total) by mouth daily.  HLD (hyperlipidemia) - atorvastatin (LIPITOR) 20 MG tablet; Take 1 tablet (20 mg total) by mouth daily. Educated on foods that increase cholesterol  Need for prophylactic vaccination and inoculation against influenza  Flu Vaccine QUAD 36+ mos PF IM (Fluarix Quad PF)  Need for prophylactic vaccination against Streptococcus pneumoniae (pneumococcus) - Pneumococcal polysaccharide vaccine 23-valent greater than or equal to 2yo subcutaneous/IM  Return in about 6 months (around 04/03/2015) for DM/HTN.        Chari Manning, [facility] [phone] 10/02/2014, 9:29 AM

## 2014-10-03 LAB — MICROALBUMIN, URINE: Microalb, Ur: 0.5 mg/dL (ref <2.0)

## 2022-03-08 ENCOUNTER — Encounter (INDEPENDENT_AMBULATORY_CARE_PROVIDER_SITE_OTHER): Payer: Self-pay | Admitting: Ophthalmology

## 2022-03-08 ENCOUNTER — Other Ambulatory Visit: Payer: Self-pay

## 2022-03-08 DIAGNOSIS — E113311 Type 2 diabetes mellitus with moderate nonproliferative diabetic retinopathy with macular edema, right eye: Secondary | ICD-10-CM

## 2022-03-08 DIAGNOSIS — H43813 Vitreous degeneration, bilateral: Secondary | ICD-10-CM

## 2022-03-08 DIAGNOSIS — E113292 Type 2 diabetes mellitus with mild nonproliferative diabetic retinopathy without macular edema, left eye: Secondary | ICD-10-CM

## 2022-03-08 DIAGNOSIS — H2513 Age-related nuclear cataract, bilateral: Secondary | ICD-10-CM

## 2022-03-08 DIAGNOSIS — H35033 Hypertensive retinopathy, bilateral: Secondary | ICD-10-CM

## 2022-03-08 DIAGNOSIS — I10 Essential (primary) hypertension: Secondary | ICD-10-CM
